# Patient Record
Sex: Male | Born: 1985 | Hispanic: Yes | Marital: Married | Smoking: Never smoker
Health system: Southern US, Community
[De-identification: ages and names within clinical notes are randomized; demographics above are authoritative.]

## PROBLEM LIST (undated history)

## (undated) DIAGNOSIS — I10 Essential (primary) hypertension: Secondary | ICD-10-CM

---

## 2015-03-30 ENCOUNTER — Encounter (HOSPITAL_COMMUNITY): Payer: Self-pay | Admitting: Emergency Medicine

## 2015-03-30 ENCOUNTER — Emergency Department (HOSPITAL_COMMUNITY)
Admission: EM | Admit: 2015-03-30 | Discharge: 2015-03-30 | Disposition: A | Discharge status: Home / Self Care | Payer: Self-pay | Source: Home / Self Care | Attending: Emergency Medicine | Admitting: Emergency Medicine

## 2015-03-30 ENCOUNTER — Emergency Department (INDEPENDENT_AMBULATORY_CARE_PROVIDER_SITE_OTHER): Payer: Self-pay

## 2015-03-30 DIAGNOSIS — J40 Bronchitis, not specified as acute or chronic: Secondary | ICD-10-CM

## 2015-03-30 DIAGNOSIS — J018 Other acute sinusitis: Secondary | ICD-10-CM

## 2015-03-30 MED ORDER — PREDNISONE 50 MG PO TABS: ORAL_TABLET | ORAL | Status: DC

## 2015-03-30 MED ORDER — HYDROCODONE-HOMATROPINE 5-1.5 MG/5ML PO SYRP: 5.0000 mL | ORAL_SOLUTION | Freq: Four times a day (QID) | ORAL | Status: DC | PRN

## 2015-03-30 MED ORDER — ALBUTEROL SULFATE 108 (90 BASE) MCG/ACT IN AEPB: 2.0000 | INHALATION_SPRAY | RESPIRATORY_TRACT | Status: AC | PRN

## 2015-03-30 MED ORDER — AZITHROMYCIN 250 MG PO TABS: ORAL_TABLET | ORAL | Status: DC

## 2015-03-30 NOTE — ED Provider Notes (Signed)
CSN: [id number]     Arrival date & time 03/30/15  [id number] History   First MD Initiated Contact with Patient 03/30/15 1831     Chief Complaint  Patient presents with  . URI   (Consider location/radiation/quality/duration/timing/severity/associated sxs/prior Treatment) HPI  He is a 29 year old man here for evaluation of upper respiratory symptoms. He states his symptoms started approximately one month ago with cough, nasal congestion, and chest congestion. He was doing okay, but then a few days ago started developing headaches. He reports headaches behind his eyes. They are throbbing in nature. They do respond to Tylenol and rest. He continues to have postnasal drainage and cough. He denies any ear pain. No sore throat. No documented temperature, but he has had some episodes of dizziness and diaphoresis. None currently. He also reports a cramping sensation in the left upper quadrant with coughing spells. It resolves after he stretches. He is a nonsmoker. No history of asthma or allergies.  History reviewed. No pertinent past medical history. No past surgical history on file. History reviewed. No pertinent family history. Social History  Substance Use Topics  . Smoking status: None  . Smokeless tobacco: None  . Alcohol Use: None    Review of Systems As in history of present illness Allergies  Amoxicillin  Home Medications   Prior to Admission medications   Medication Sig Start Date End Date Taking? Authorizing Provider  Albuterol Sulfate (PROAIR RESPICLICK) 108 (90 BASE) MCG/ACT AEPB Inhale 2 puffs into the lungs every 4 (four) hours as needed (cough). 03/30/15   Charm RingsErin J Bryonna Sundby, MD  azithromycin (ZITHROMAX Z-PAK) 250 MG tablet Take 2 pills today, then 1 pill daily until gone. 03/30/15   Charm RingsErin J Giovanie Lefebre, MD  HYDROcodone-homatropine (HYCODAN) 5-1.5 MG/5ML syrup Take 5 mLs by mouth every 6 (six) hours as needed for cough. 03/30/15   Charm RingsErin J Gabi Mcfate, MD  predniSONE (DELTASONE) 50 MG tablet Take 1  pill daily for 5 days. 03/30/15   Charm RingsErin J Jelena Malicoat, MD   Meds Ordered and Administered this Visit  Medications - No data to display  BP 159/103 mmHg  Pulse 103  Temp(Src) 98 F (36.7 C) (Oral)  Resp 16  SpO2 97% No data found.   Physical Exam  Constitutional: He is oriented to person, place, and time. He appears well-developed and well-nourished. No distress.  HENT:  Nose: Nose normal.  Mouth/Throat: Oropharynx is clear and moist. No oropharyngeal exudate.  No sinus tenderness  Eyes: Conjunctivae are normal.  Neck: Neck supple.  Cardiovascular: Normal rate, regular rhythm and normal heart sounds.   No murmur heard. Pulmonary/Chest: Effort normal. No respiratory distress. He has no wheezes. He has no rales.  He has an inspiratory squeak in the right lung base  Abdominal: Soft. Bowel sounds are normal. He exhibits no distension. There is no tenderness. There is no rebound and no guarding.  Lymphadenopathy:    He has cervical adenopathy.  Neurological: He is alert and oriented to person, place, and time.  Skin: Skin is warm and dry.    ED Course  Procedures (including critical care time)  Labs Review Labs Reviewed - No data to display  Imaging Review Dg Chest 2 View  03/30/2015  CLINICAL DATA:  29 year old male with subacute cough and congestion for 1 month. EXAM: CHEST  2 VIEW COMPARISON:  None FINDINGS: The cardiomediastinal silhouette is unremarkable. There is no evidence of focal airspace disease, pulmonary edema, suspicious pulmonary nodule/mass, pleural effusion, or pneumothorax. No acute bony abnormalities are identified.  IMPRESSION: No active cardiopulmonary disease. Electronically Signed   By: Harmon Pier M.D.   On: 03/30/2015 19:40      MDM   1. Other acute sinusitis   2. Bronchitis    Treat with prednisone and azithromycin. Hycodan as needed for cough. Albuterol as needed for cough or shortness of breath. Return precautions reviewed.    Charm Rings,  MD 03/30/15 1950

## 2015-03-30 NOTE — Discharge Instructions (Signed)
You have bronchitis. °Take azithromycin and prednisone as prescribed. °Use hycodan as needed for cough. °Use the albuterol every 4 hours as needed for wheezing or cough. °You should see improvement in the next 3-5 days. °If you develop fevers, difficulty breathing, or are just not getting better, please come back or go to the emergency room. ° °

## 2015-04-06 ENCOUNTER — Encounter (HOSPITAL_COMMUNITY): Payer: Self-pay | Admitting: Emergency Medicine

## 2015-04-06 ENCOUNTER — Emergency Department (INDEPENDENT_AMBULATORY_CARE_PROVIDER_SITE_OTHER)
Admission: EM | Admit: 2015-04-06 | Discharge: 2015-04-06 | Disposition: A | Discharge status: Home / Self Care | Payer: Self-pay | Source: Home / Self Care | Attending: Family Medicine | Admitting: Family Medicine

## 2015-04-06 DIAGNOSIS — R0981 Nasal congestion: Secondary | ICD-10-CM

## 2015-04-06 DIAGNOSIS — J9801 Acute bronchospasm: Secondary | ICD-10-CM

## 2015-04-06 DIAGNOSIS — I10 Essential (primary) hypertension: Secondary | ICD-10-CM

## 2015-04-06 DIAGNOSIS — J069 Acute upper respiratory infection, unspecified: Secondary | ICD-10-CM

## 2015-04-06 HISTORY — DX: Essential (primary) hypertension: I10

## 2015-04-06 MED ORDER — PREDNISONE 20 MG PO TABS: ORAL_TABLET | ORAL | Status: AC

## 2015-04-06 NOTE — Discharge Instructions (Signed)
Bronchospasm, Adult Continue to use her albuterol HFA 2 puffs every 4 hours as needed for wheezing Restart prednisone taper dose A bronchospasm is a spasm or tightening of the airways going into the lungs. During a bronchospasm breathing becomes more difficult because the airways get smaller. When this happens there can be coughing, a whistling sound when breathing (wheezing), and difficulty breathing. Bronchospasm is often associated with asthma, but not all patients who experience a bronchospasm have asthma. CAUSES  A bronchospasm is caused by inflammation or irritation of the airways. The inflammation or irritation may be triggered by:   Allergies (such as to animals, pollen, food, or mold). Allergens that cause bronchospasm may cause wheezing immediately after exposure or many hours later.   Infection. Viral infections are believed to be the most common cause of bronchospasm.   Exercise.   Irritants (such as pollution, cigarette smoke, strong odors, aerosol sprays, and paint fumes).   Weather changes. Winds increase molds and pollens in the air. Rain refreshes the air by washing irritants out. Cold air may cause inflammation.   Stress and emotional upset.  SIGNS AND SYMPTOMS   Wheezing.   Excessive nighttime coughing.   Frequent or severe coughing with a simple cold.   Chest tightness.   Shortness of breath.  DIAGNOSIS  Bronchospasm is usually diagnosed through a history and physical exam. Tests, such as chest X-rays, are sometimes done to look for other conditions. TREATMENT   Inhaled medicines can be given to open up your airways and help you breathe. The medicines can be given using either an inhaler or a nebulizer machine.  Corticosteroid medicines may be given for severe bronchospasm, usually when it is associated with asthma. HOME CARE INSTRUCTIONS   Always have a plan prepared for seeking medical care. Know when to call your health care provider and local  emergency services (911 in the [place] Know where you can access local emergency care.  Only take medicines as directed by your health care provider.  If you were prescribed an inhaler or nebulizer machine, ask your health care provider to explain how to use it correctly. Always use a spacer with your inhaler if you were given one.  It is necessary to remain calm during an attack. Try to relax and breathe more slowly.  Control your home environment in the following ways:   Change your heating and air conditioning filter at least once a month.   Limit your use of fireplaces and wood stoves.  Do not smoke and do not allow smoking in your home.   Avoid exposure to perfumes and fragrances.   Get rid of pests (such as roaches and mice) and their droppings.   Throw away plants if you see mold on them.   Keep your house clean and dust free.   Replace carpet with wood, tile, or vinyl flooring. Carpet can trap dander and dust.   Use allergy-proof pillows, mattress covers, and box spring covers.   Wash bed sheets and blankets every week in hot water and dry them in a dryer.   Use blankets that are made of polyester or cotton.   Wash hands frequently. SEEK MEDICAL CARE IF:   You have muscle aches.   You have chest pain.   The sputum changes from clear or white to yellow, green, gray, or bloody.   The sputum you cough up gets thicker.   There are problems that may be related to the medicine you are given, such as a rash,  itching, swelling, or trouble breathing.  SEEK IMMEDIATE MEDICAL CARE IF:   You have worsening wheezing and coughing even after taking your prescribed medicines.   You have increased difficulty breathing.   You develop severe chest pain. MAKE SURE YOU:   Understand these instructions.  Will watch your condition.  Will get help right away if you are not doing well or get worse.   This information is not intended to replace advice given  to you by your health care provider. Make sure you discuss any questions you have with your health care provider.   Document Released: 06/06/2003 Document Revised: 06/24/2014 Document Reviewed: 11/23/2012 Elsevier Interactive Patient Education 2016 [employer].  How to Use an Inhaler Using your inhaler correctly is very important. Good technique will make sure that the medicine reaches your lungs.  HOW TO USE AN INHALER:  Take the cap off the inhaler.  If this is the first time using your inhaler, you need to prime it. Shake the inhaler for 5 seconds. Release four puffs into the air, away from your face. Ask your doctor for help if you have questions.  Shake the inhaler for 5 seconds.  Turn the inhaler so the bottle is above the mouthpiece.  Put your pointer finger on top of the bottle. Your thumb holds the bottom of the inhaler.  Open your mouth.  Either hold the inhaler away from your mouth (the width of 2 fingers) or place your lips tightly around the mouthpiece. Ask your doctor which way to use your inhaler.  Breathe out as much air as possible.  Breathe in and push down on the bottle 1 time to release the medicine. You will feel the medicine go in your mouth and throat.  Continue to take a deep breath in very slowly. Try to fill your lungs.  After you have breathed in completely, hold your breath for 10 seconds. This will help the medicine to settle in your lungs. If you cannot hold your breath for 10 seconds, hold it for as long as you can before you breathe out.  Breathe out slowly, through pursed lips. Whistling is an example of pursed lips.  If your doctor has told you to take more than 1 puff, wait at least 15-30 seconds between puffs. This will help you get the best results from your medicine. Do not use the inhaler more than your doctor tells you to.  Put the cap back on the inhaler.  Follow the directions from your doctor or from the inhaler package about cleaning  the inhaler. If you use more than one inhaler, ask your doctor which inhalers to use and what order to use them in. Ask your doctor to help you figure out when you will need to refill your inhaler.  If you use a steroid inhaler, always rinse your mouth with water after your last puff, gargle and spit out the water. Do not swallow the water. GET HELP IF:  The inhaler medicine only partially helps to stop wheezing or shortness of breath.  You are having trouble using your inhaler.  You have some increase in thick spit (phlegm). GET HELP RIGHT AWAY IF:  The inhaler medicine does not help your wheezing or shortness of breath or you have tightness in your chest.  You have dizziness, headaches, or fast heart rate.  You have chills, fever, or night sweats.  You have a large increase of thick spit, or your thick spit is bloody. MAKE SURE YOU:  Understand these instructions.  Will watch your condition.  Will get help right away if you are not doing well or get worse.   This information is not intended to replace advice given to you by your health care provider. Make sure you discuss any questions you have with your health care provider.   Document Released: 03/12/2008 Document Revised: 03/24/2013 Document Reviewed: 12/31/2012 Elsevier Interactive Patient Education 2016 [employer].  Hypertension Hypertension, commonly called high blood pressure, is when the force of blood pumping through your arteries is too strong. Your arteries are the blood vessels that carry blood from your heart throughout your body. A blood pressure reading consists of a higher number over a lower number, such as 110/72. The higher number (systolic) is the pressure inside your arteries when your heart pumps. The lower number (diastolic) is the pressure inside your arteries when your heart relaxes. Ideally you want your blood pressure below 120/80. Hypertension forces your heart to work harder to pump blood. Your  arteries may become narrow or stiff. Having untreated or uncontrolled hypertension can cause heart attack, stroke, kidney disease, and other problems. RISK FACTORS Some risk factors for high blood pressure are controllable. Others are not.  Risk factors you cannot control include:   Race. You may be at higher risk if you are African American.  Age. Risk increases with age.  Gender. Men are at higher risk than women before age 31 years. After age 35, women are at higher risk than men. Risk factors you can control include:  Not getting enough exercise or physical activity.  Being overweight.  Getting too much fat, sugar, calories, or salt in your diet.  Drinking too much alcohol. SIGNS AND SYMPTOMS Hypertension does not usually cause signs or symptoms. Extremely high blood pressure (hypertensive crisis) may cause headache, anxiety, shortness of breath, and nosebleed. DIAGNOSIS To check if you have hypertension, your health care provider will measure your blood pressure while you are seated, with your arm held at the level of your heart. It should be measured at least twice using the same arm. Certain conditions can cause a difference in blood pressure between your right and left arms. A blood pressure reading that is higher than normal on one occasion does not mean that you need treatment. If it is not clear whether you have high blood pressure, you may be asked to return on a different day to have your blood pressure checked again. Or, you may be asked to monitor your blood pressure at home for 1 or more weeks. TREATMENT Treating high blood pressure includes making lifestyle changes and possibly taking medicine. Living a healthy lifestyle can help lower high blood pressure. You may need to change some of your habits. Lifestyle changes may include:  Following the DASH diet. This diet is high in fruits, vegetables, and whole grains. It is low in salt, red meat, and added sugars.  Keep your  sodium intake below 2,300 mg per day.  Getting at least 30-45 minutes of aerobic exercise at least 4 times per week.  Losing weight if necessary.  Not smoking.  Limiting alcoholic beverages.  Learning ways to reduce stress. Your health care provider may prescribe medicine if lifestyle changes are not enough to get your blood pressure under control, and if one of the following is true:  You are 79-30 years of age and your systolic blood pressure is above 140.  You are 36 years of age or older, and your systolic blood pressure is  above 150.  Your diastolic blood pressure is above 90.  You have diabetes, and your systolic blood pressure is over 140 or your diastolic blood pressure is over 90.  You have kidney disease and your blood pressure is above 140/90.  You have heart disease and your blood pressure is above 140/90. Your personal target blood pressure may vary depending on your medical conditions, your age, and other factors. HOME CARE INSTRUCTIONS  Have your blood pressure rechecked as directed by your health care provider.   Take medicines only as directed by your health care provider. Follow the directions carefully. Blood pressure medicines must be taken as prescribed. The medicine does not work as well when you skip doses. Skipping doses also puts you at risk for problems.  Do not smoke.   Monitor your blood pressure at home as directed by your health care provider. SEEK MEDICAL CARE IF:   You think you are having a reaction to medicines taken.  You have recurrent headaches or feel dizzy.  You have swelling in your ankles.  You have trouble with your vision. SEEK IMMEDIATE MEDICAL CARE IF:  You develop a severe headache or confusion.  You have unusual weakness, numbness, or feel faint.  You have severe chest or abdominal pain.  You vomit repeatedly.  You have trouble breathing. MAKE SURE YOU:   Understand these instructions.  Will watch your  condition.  Will get help right away if you are not doing well or get worse.   This information is not intended to replace advice given to you by your health care provider. Make sure you discuss any questions you have with your health care provider.   Document Released: 06/03/2005 Document Revised: 10/18/2014 Document Reviewed: 03/26/2013 Elsevier Interactive Patient Education 2016 Elsevier Inc.  Upper Respiratory Infection, Adult For drainage use Zyrtec or Allegra. For sinus congestion use Sudafed PE 10 mg every 4 hours as needed. Also use copious amounts of saline nasal spray. Tylenol every 4 hours as needed for discomfort. Most upper respiratory infections (URIs) are caused by a virus. A URI affects the nose, throat, and upper air passages. The most common type of URI is often called "the common cold." HOME CARE   Take medicines only as told by your doctor.  Gargle warm saltwater or take cough drops to comfort your throat as told by your doctor.  Use a warm mist humidifier or inhale steam from a shower to increase air moisture. This may make it easier to breathe.  Drink enough fluid to keep your pee (urine) clear or pale yellow.  Eat soups and other clear broths.  Have a healthy diet.  Rest as needed.  Go back to work when your fever is gone or your doctor says it is okay.  You may need to stay home longer to avoid giving your URI to others.  You can also wear a face mask and wash your hands often to prevent spread of the virus.  Use your inhaler more if you have asthma.  Do not use any tobacco products, including cigarettes, chewing tobacco, or electronic cigarettes. If you need help quitting, ask your doctor. GET HELP IF:  You are getting worse, not better.  Your symptoms are not helped by medicine.  You have chills.  You are getting more short of breath.  You have brown or red mucus.  You have yellow or brown discharge from your nose.  You have pain in  your face, especially when you bend forward.  You have a fever.  You have puffy (swollen) neck glands.  You have pain while swallowing.  You have white areas in the back of your throat. GET HELP RIGHT AWAY IF:   You have very bad or constant:  Headache.  Ear pain.  Pain in your forehead, behind your eyes, and over your cheekbones (sinus pain).  Chest pain.  You have long-lasting (chronic) lung disease and any of the following:  Wheezing.  Long-lasting cough.  Coughing up blood.  A change in your usual mucus.  You have a stiff neck.  You have changes in your:  Vision.  Hearing.  Thinking.  Mood. MAKE SURE YOU:   Understand these instructions.  Will watch your condition.  Will get help right away if you are not doing well or get worse.   This information is not intended to replace advice given to you by your health care provider. Make sure you discuss any questions you have with your health care provider.   Document Released: 11/20/2007 Document Revised: 10/18/2014 Document Reviewed: 09/08/2013 Elsevier Interactive Patient Education [employer]2016 Elsevier Inc.

## 2015-04-06 NOTE — ED Notes (Signed)
Reports diagnosed with bronchitis and sinusitis 2 weeks ago.  Reports taking zpac and another medicine and bronchitis cleared.  Patient is concerned for continued sinus pressure, headache, weakness, and fatigue.

## 2015-04-06 NOTE — ED Provider Notes (Signed)
CSN: [id number]     Arrival date & time 04/06/15  1707 History   First MD Initiated Contact with Patient 04/06/15 1819     Chief Complaint  Patient presents with  . Sinusitis   (Consider location/radiation/quality/duration/timing/severity/associated sxs/prior Treatment) HPI Comments: 29 year old obese male was in the urgent care 1 week ago and diagnosed with sinusitis and bronchitis. He was treated with prednisone, Z-Pak and Hycodan syrup. He states that while at his own he was feeling much better. He was also given an albuterol HFA and states he has been compliant with that every 4 hours. He continues to have a cough but primarily his concern is that of frontal headache and sinus pressure. Denies fever. Denies runny nose or PND. Patient is a 29 y.o. male presenting with URI.  URI Presenting symptoms: congestion, cough and fatigue   Presenting symptoms: no ear pain, no fever, no rhinorrhea and no sore throat   Severity:  Moderate Onset quality:  Gradual Duration:  2 months Timing:  Intermittent Progression:  Waxing and waning Chronicity:  Recurrent Relieved by:  Prescription medications Associated symptoms: headaches and sinus pain   Associated symptoms: no myalgias, no neck pain, no swollen glands and no wheezing     Past Medical History  Diagnosis Date  . Hypertension    History reviewed. No pertinent past surgical history. No family history on file. Social History  Substance Use Topics  . Smoking status: Never Smoker   . Smokeless tobacco: None  . Alcohol Use: No    Review of Systems  Constitutional: Positive for activity change and fatigue. Negative for fever and diaphoresis.  HENT: Positive for congestion. Negative for ear discharge, ear pain, facial swelling, postnasal drip, rhinorrhea, sore throat and trouble swallowing.   Eyes: Negative for pain, discharge and redness.  Respiratory: Positive for cough. Negative for chest tightness, shortness of breath and wheezing.    Cardiovascular: Negative.   Gastrointestinal: Negative.   Musculoskeletal: Negative.  Negative for myalgias, neck pain and neck stiffness.  Neurological: Positive for headaches.    Allergies  Amoxicillin  Home Medications   Prior to Admission medications   Medication Sig Start Date End Date Taking? Authorizing Provider  AMLODIPINE BESYLATE PO Take by mouth.   Yes Historical Provider, MD  HYDROCHLOROTHIAZIDE PO Take by mouth.   Yes Historical Provider, MD  METOPROLOL TARTRATE PO Take by mouth.   Yes Historical Provider, MD  Albuterol Sulfate (PROAIR RESPICLICK) 108 (90 BASE) MCG/ACT AEPB Inhale 2 puffs into the lungs every 4 (four) hours as needed (cough). 03/30/15   Charm RingsErin J Honig, MD  predniSONE (DELTASONE) 20 MG tablet 3 Tabs PO Days 1-3, then 2 tabs PO Days 4-6, then 1 tab PO Day 7-9, then Half Tab PO Day 10-12 04/06/15   Hayden Rasmussenavid Omarius Grantham, NP   Meds Ordered and Administered this Visit  Medications - No data to display  BP 156/114 mmHg  Pulse 86  Temp(Src) 97.8 F (36.6 C) (Oral)  Resp 12  SpO2 96% No data found.   Physical Exam  Constitutional: He is oriented to person, place, and time. He appears well-developed and well-nourished. No distress.  HENT:  Mouth/Throat: No oropharyngeal exudate.  Bilateral TMs are mildly retracted. No erythema Oropharynx with minor erythema and mild clear PND.  Eyes: Conjunctivae and EOM are normal.  Neck: Normal range of motion. Neck supple.  Cardiovascular: Normal rate, regular rhythm and normal heart sounds.   Pulmonary/Chest: Effort normal. No respiratory distress. He has wheezes.  Bilateral end-expiratory wheezes.  Prolonged expiratory phase.  Musculoskeletal: Normal range of motion. He exhibits no edema.  Lymphadenopathy:    He has no cervical adenopathy.  Neurological: He is alert and oriented to person, place, and time.  Skin: Skin is warm and dry. No rash noted.  Psychiatric: He has a normal mood and affect.  Nursing note and vitals  reviewed.   ED Course  Procedures (including critical care time)  Labs Review Labs Reviewed - No data to display  Imaging Review No results found.   Visual Acuity Review  Right Eye Distance:   Left Eye Distance:   Bilateral Distance:    Right Eye Near:   Left Eye Near:    Bilateral Near:         MDM   1. URI (upper respiratory infection)   2. Sinus congestion   3. Bronchospasm   4. Essential hypertension    Continue to use her albuterol HFA 2 puffs every 4 hours as needed for wheezing Restart prednisone taper dose  For drainage use Zyrtec or Allegra. For sinus congestion use Sudafed PE 10 mg every 4 hours as needed. Also use copious amounts of saline nasal spray. Tylenol every 4 hours as needed for discomfort.    Hayden Rasmussen, NP 04/06/15 1843  Hayden Rasmussen, NP 04/06/15 [phone]

## 2017-01-22 ENCOUNTER — Encounter (HOSPITAL_COMMUNITY): Payer: Self-pay | Admitting: Family Medicine

## 2017-01-22 ENCOUNTER — Ambulatory Visit (INDEPENDENT_AMBULATORY_CARE_PROVIDER_SITE_OTHER): Payer: Managed Care, Other (non HMO)

## 2017-01-22 ENCOUNTER — Ambulatory Visit (HOSPITAL_COMMUNITY)
Admission: EM | Admit: 2017-01-22 | Discharge: 2017-01-22 | Disposition: A | Discharge status: Home / Self Care | Payer: Managed Care, Other (non HMO) | Attending: Internal Medicine | Admitting: Internal Medicine

## 2017-01-22 DIAGNOSIS — R0982 Postnasal drip: Secondary | ICD-10-CM

## 2017-01-22 DIAGNOSIS — I160 Hypertensive urgency: Secondary | ICD-10-CM

## 2017-01-22 DIAGNOSIS — R0601 Orthopnea: Secondary | ICD-10-CM | POA: Diagnosis not present

## 2017-01-22 DIAGNOSIS — I10 Essential (primary) hypertension: Secondary | ICD-10-CM

## 2017-01-22 DIAGNOSIS — R05 Cough: Secondary | ICD-10-CM

## 2017-01-22 MED ORDER — AMLODIPINE BESYLATE 10 MG PO TABS: 10.0000 mg | ORAL_TABLET | Freq: Every day | ORAL | 0 refills | Status: AC

## 2017-01-22 MED ORDER — METOPROLOL SUCCINATE ER 50 MG PO TB24: 50.0000 mg | ORAL_TABLET | Freq: Every day | ORAL | 0 refills | Status: AC

## 2017-01-22 MED ORDER — HYDROCHLOROTHIAZIDE 25 MG PO TABS: 25.0000 mg | ORAL_TABLET | Freq: Every day | ORAL | 0 refills | Status: AC

## 2017-01-22 NOTE — ED Triage Notes (Signed)
Pt here for SOB when lying down at night and exertion. sts also that he has been coughing up phlegm mixed with blood. Denies fever. Denies edema. sts also he needs refill on HTN meds.

## 2017-01-22 NOTE — Discharge Instructions (Signed)
Her EKG is not showing any acute abnormalities. The chest x-ray is negative for acute problems. He definitely need to follow-up with a primary care provider and possibly adjust her blood pressure medications. You are given refills on your current medications. He should take them daily. He developed chest pain, heaviness, tightness, fullness or pressure, increased shortness of breath especially with exertion or develop edema or swelling in the legs you should seek medical attention promptly by the emergency department or your primary care provider

## 2017-01-22 NOTE — ED Notes (Signed)
Patient transported to X-ray  Not in treatment room 

## 2017-01-22 NOTE — ED Provider Notes (Signed)
[facility]    CSN: [id number] Arrival date & time: 01/22/17  1014     History   Chief Complaint Chief Complaint  Patient presents with  . Hypertension  . Cough  . Shortness of Breath    HPI Danny Campos is a 31 y.o. male.   [age] year old obese male with history of essential hypertension uncontrolled for several years complaining of occasional cough and mucus in the upper anterior chest. He is also having some shortness of breath when supine he is not sleeping well and has frequent awakenings at night. He is not sure whether the shortness of breath or the cough and mucus in his throat and chest is what is waking him up. He states that after he is up for about 10 minutes he is breathing a little better. He also states that the cough with mucus is occasionally and lightly blood-tinged. He has shortness of breath with exertion such as lifting a lot of weight. But not with ambulation. Although he has been advised to follow-up with the PCP 2 years ago he states that he has not been able to find one. [place].  Denies chest pain, heaviness, tightness, fullness or pressure.      Past Medical History:  Diagnosis Date  . Hypertension     There are no active problems to display for this patient.   History reviewed. No pertinent surgical history.     Home Medications    Prior to Admission medications   Medication Sig Start Date End Date Taking? Authorizing Provider  Albuterol Sulfate (PROAIR RESPICLICK) 108 (90 BASE) MCG/ACT AEPB Inhale 2 puffs into the lungs every 4 (four) hours as needed (cough). 03/30/15   Charm RingsHonig, Erin J, MD  amLODipine (NORVASC) 10 MG tablet Take 1 tablet (10 mg total) by mouth daily. 01/22/17   Hayden RasmussenMabe, Saanya Zieske, NP  hydrochlorothiazide (HYDRODIURIL) 25 MG tablet Take 1 tablet (25 mg total) by mouth daily. 01/22/17   Hayden RasmussenMabe, Aubrea Meixner, NP  metoprolol succinate (TOPROL XL) 50 MG 24 hr tablet Take 1 tablet  (50 mg total) by mouth daily. Take with or immediately following a meal. 01/22/17   Hayden RasmussenMabe, Debbera Wolken, NP  METOPROLOL TARTRATE PO Take by mouth.    [provider]  predniSONE (DELTASONE) 20 MG tablet 3 Tabs PO Days 1-3, then 2 tabs PO Days 4-6, then 1 tab PO Day 7-9, then Half Tab PO Day 10-12 04/06/15   Hayden RasmussenMabe, Shuntel Fishburn, NP    Family History History reviewed. No pertinent family history.  Social History Social History  Substance Use Topics  . Smoking status: Never Smoker  . Smokeless tobacco: Not on file  . Alcohol use No     Allergies   Amoxicillin   Review of Systems Review of Systems  Constitutional: Negative.  Negative for fever.  HENT: Positive for postnasal drip.   Eyes: Negative.   Respiratory: Positive for cough and shortness of breath. Negative for chest tightness.   Cardiovascular: Negative for chest pain, palpitations and leg swelling.  Gastrointestinal: Negative.   Genitourinary: Negative.   Skin: Negative.   Neurological: Negative.   All other systems reviewed and are negative.    Physical Exam Triage Vital Signs ED Triage Vitals  Enc Vitals Group     BP      Pulse      Resp      Temp      Temp src  SpO2      Weight      Height      Head Circumference      Peak Flow      Pain Score      Pain Loc      Pain Edu?      Excl. in GC?    No data found.   Updated Vital Signs BP (!) 176/126 (BP Location: Left Arm)   Pulse (!) 102   Temp 98.4 F (36.9 C)   Resp 18   SpO2 97%   Visual Acuity Right Eye Distance:   Left Eye Distance:   Bilateral Distance:    Right Eye Near:   Left Eye Near:    Bilateral Near:     Physical Exam  Constitutional: He is oriented to person, place, and time. He appears well-developed and well-nourished. No distress.  HENT:  Head: Normocephalic and atraumatic.  Eyes: EOM are normal.  Neck: Normal range of motion. Neck supple.  Cardiovascular: Regular rhythm, normal heart sounds and intact distal pulses.     No murmur heard. Borderline tachycardia  Pulmonary/Chest: Effort normal and breath sounds normal. No respiratory distress.  Slightly prolonged expiratory phase. No wheezes or other adventitious sounds.  Musculoskeletal: He exhibits no edema or deformity.  Neurological: He is alert and oriented to person, place, and time. He exhibits normal muscle tone.  Skin: Skin is warm and dry.  Psychiatric: He has a normal mood and affect.  Nursing note and vitals reviewed.    UC Treatments / Results  Labs (all labs ordered are listed, but only abnormal results are displayed) Labs Reviewed - No data to display  EKG  EKG Interpretation None     ED ECG REPORT   Date: 01/22/2017  Rate: 93  Rhythm: normal sinus rhythm  QRS Axis: normal  Intervals: QT prolonged  ST/T Wave abnormalities: nonspecific T wave changes V5-V6  Conduction Disutrbances:none  Narrative Interpretation:   Old EKG Reviewed: none available  I have personally reviewed the EKG tracing and agree with the computerized printout as noted.   Radiology Dg Chest 2 View  Result Date: 01/22/2017 CLINICAL DATA:  Productive cough. EXAM: CHEST  2 VIEW COMPARISON:  Radiographs of March 30, 2015. FINDINGS: The heart size and mediastinal contours are within normal limits. Both lungs are clear. No pneumothorax or pleural effusion is noted. The visualized skeletal structures are unremarkable. IMPRESSION: No active cardiopulmonary disease. Electronically Signed   By: Lupita Raider, M.D.   On: 01/22/2017 12:29    Procedures Procedures (including critical care time)  Medications Ordered in UC Medications - No data to display   Initial Impression / Assessment and Plan / UC Course  I have reviewed the triage vital signs and the nursing notes.  Pertinent labs & imaging results that were available during my care of the patient were reviewed by me and considered in my medical decision making (see chart for details).       Final  Clinical Impressions(s) / UC Diagnoses   Final diagnoses:  Essential hypertension  PND (post-nasal drip)  Orthopnea  Hypertensive urgency    New Prescriptions New Prescriptions   AMLODIPINE (NORVASC) 10 MG TABLET    Take 1 tablet (10 mg total) by mouth daily.   HYDROCHLOROTHIAZIDE (HYDRODIURIL) 25 MG TABLET    Take 1 tablet (25 mg total) by mouth daily.   METOPROLOL SUCCINATE (TOPROL XL) 50 MG 24 HR TABLET    Take 1 tablet (50 mg total) by  mouth daily. Take with or immediately following a meal.     Controlled Substance Prescriptions [place] Controlled Substance Registry consulted?    Hayden Rasmussen, NP 01/22/17 1332

## 2018-02-25 IMAGING — DX DG CHEST 2V
2 series · 2 of 2 positions shown · non-contrast
Comparison: Radiographs March 30, 2015.

CLINICAL DATA: Productive cough.

EXAM:
CHEST  2 VIEW

[chest pa]
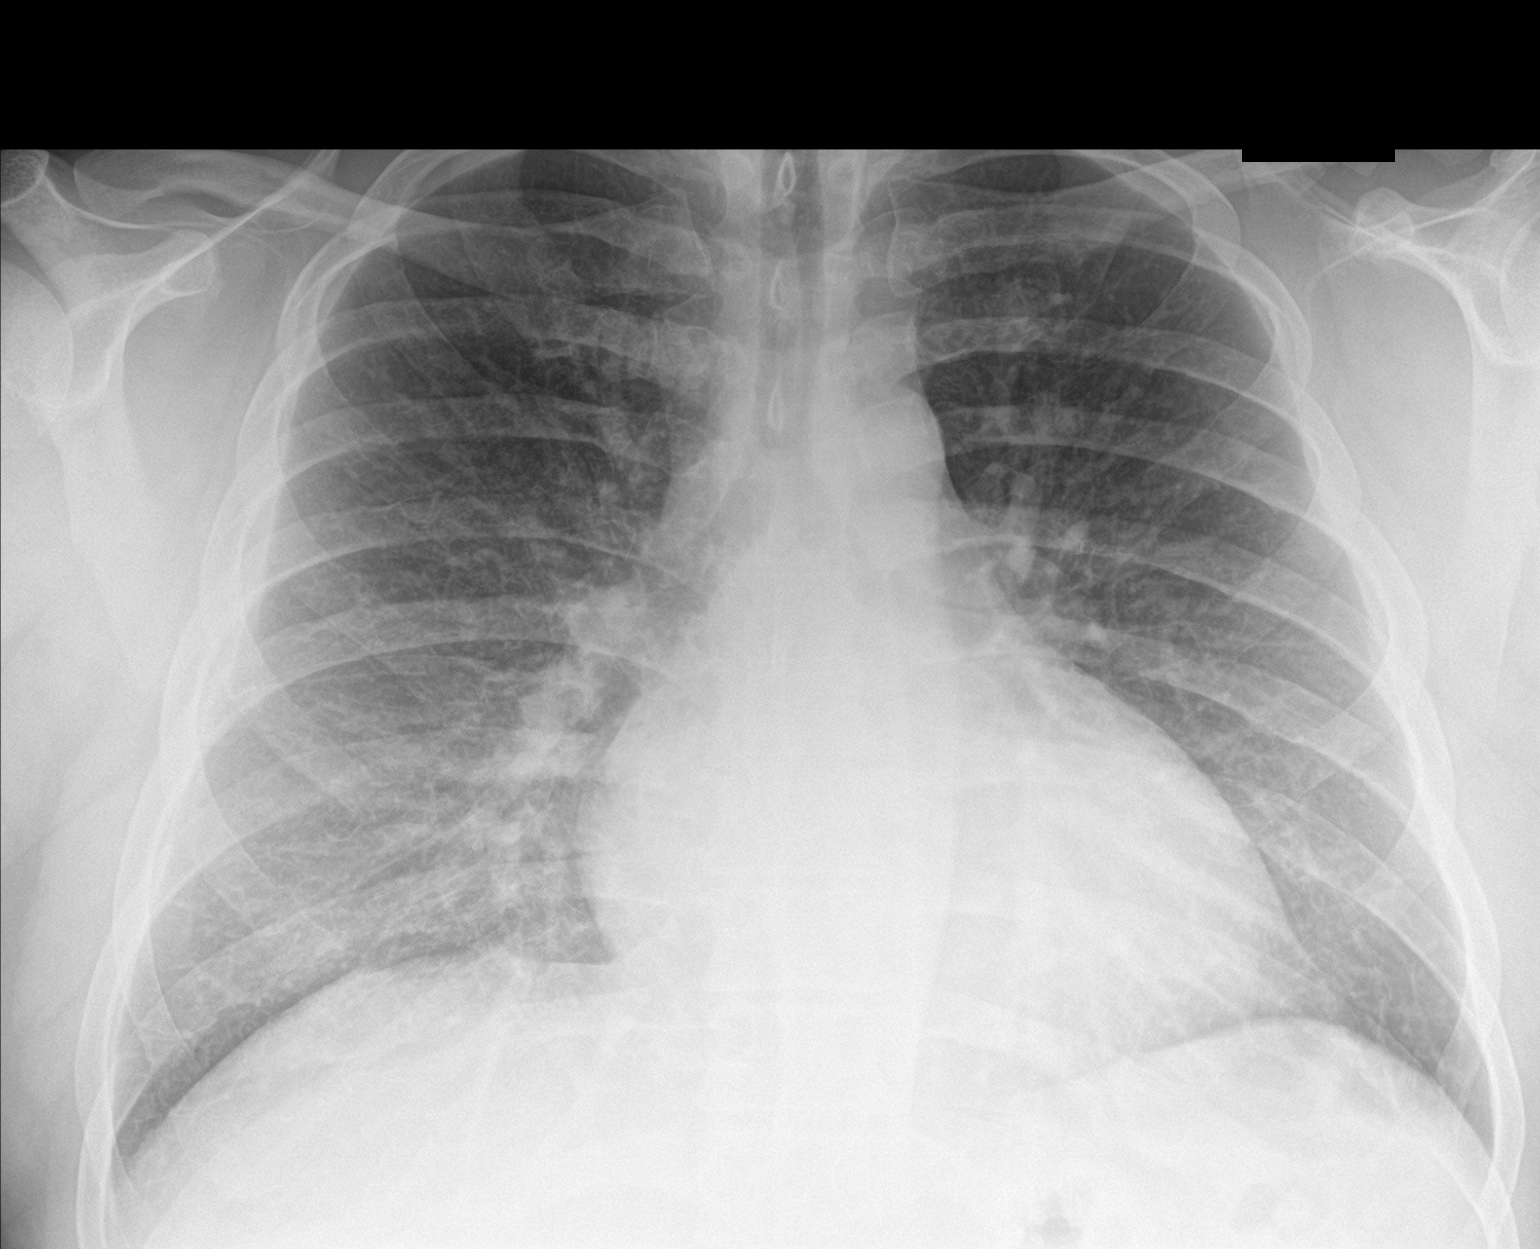

[chest lat]
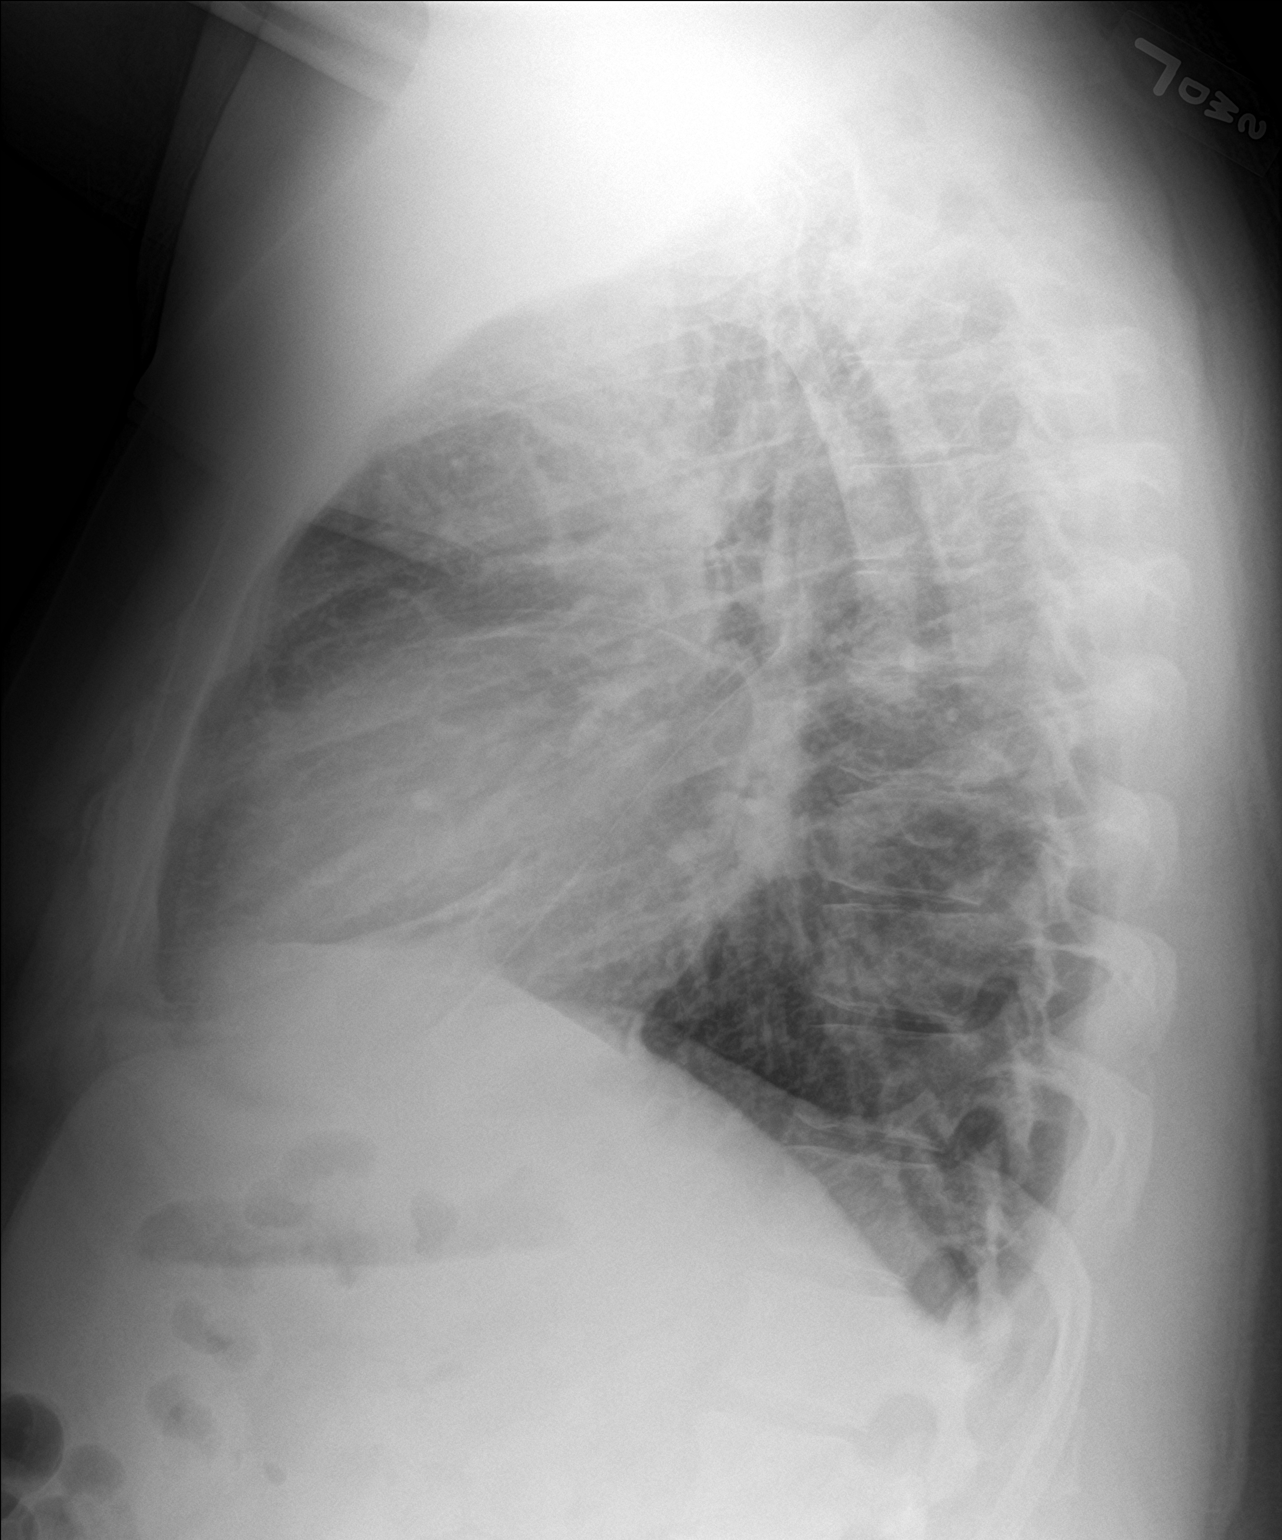

[2 of 2 positions shown; findings below may reference images not displayed]

FINDINGS: The heart size and mediastinal contours are within normal limits.
Both lungs are clear. No pneumothorax or pleural effusion is noted.
The visualized skeletal structures are unremarkable.
IMPRESSION: No active cardiopulmonary disease.
